# Patient Record
Sex: Male | Born: 1960 | Race: White | Hispanic: No | Marital: Married | Smoking: Current every day smoker
Health system: Southern US, Community
[De-identification: ages and names within clinical notes are randomized; demographics above are authoritative.]

## PROBLEM LIST (undated history)

## (undated) HISTORY — PX: BUNIONECTOMY: SHX129

---

## 1993-07-28 HISTORY — PX: FOOT ARTHROPLASTY: SHX1657

## 1993-07-28 HISTORY — PX: OTHER SURGICAL HISTORY: SHX169

## 2002-09-24 HISTORY — PX: APPENDECTOMY: SHX54

## 2005-04-02 HISTORY — PX: FOOT TENOTOMY: SHX1672

## 2005-04-02 HISTORY — PX: OTHER SURGICAL HISTORY: SHX169

## 2007-06-03 ENCOUNTER — Emergency Department (HOSPITAL_COMMUNITY): Admission: EM | Admit: 2007-06-03 | Discharge: 2007-06-03 | Payer: Self-pay | Admitting: Emergency Medicine

## 2008-05-23 IMAGING — CR DG CHEST 2V
2 series · 2 of 2 positions shown · non-contrast
Comparison: none

CLINICAL DATA: Chest tightness.
 CHEST - 2 VIEWS: 
 No comparison.

[w chest pa]
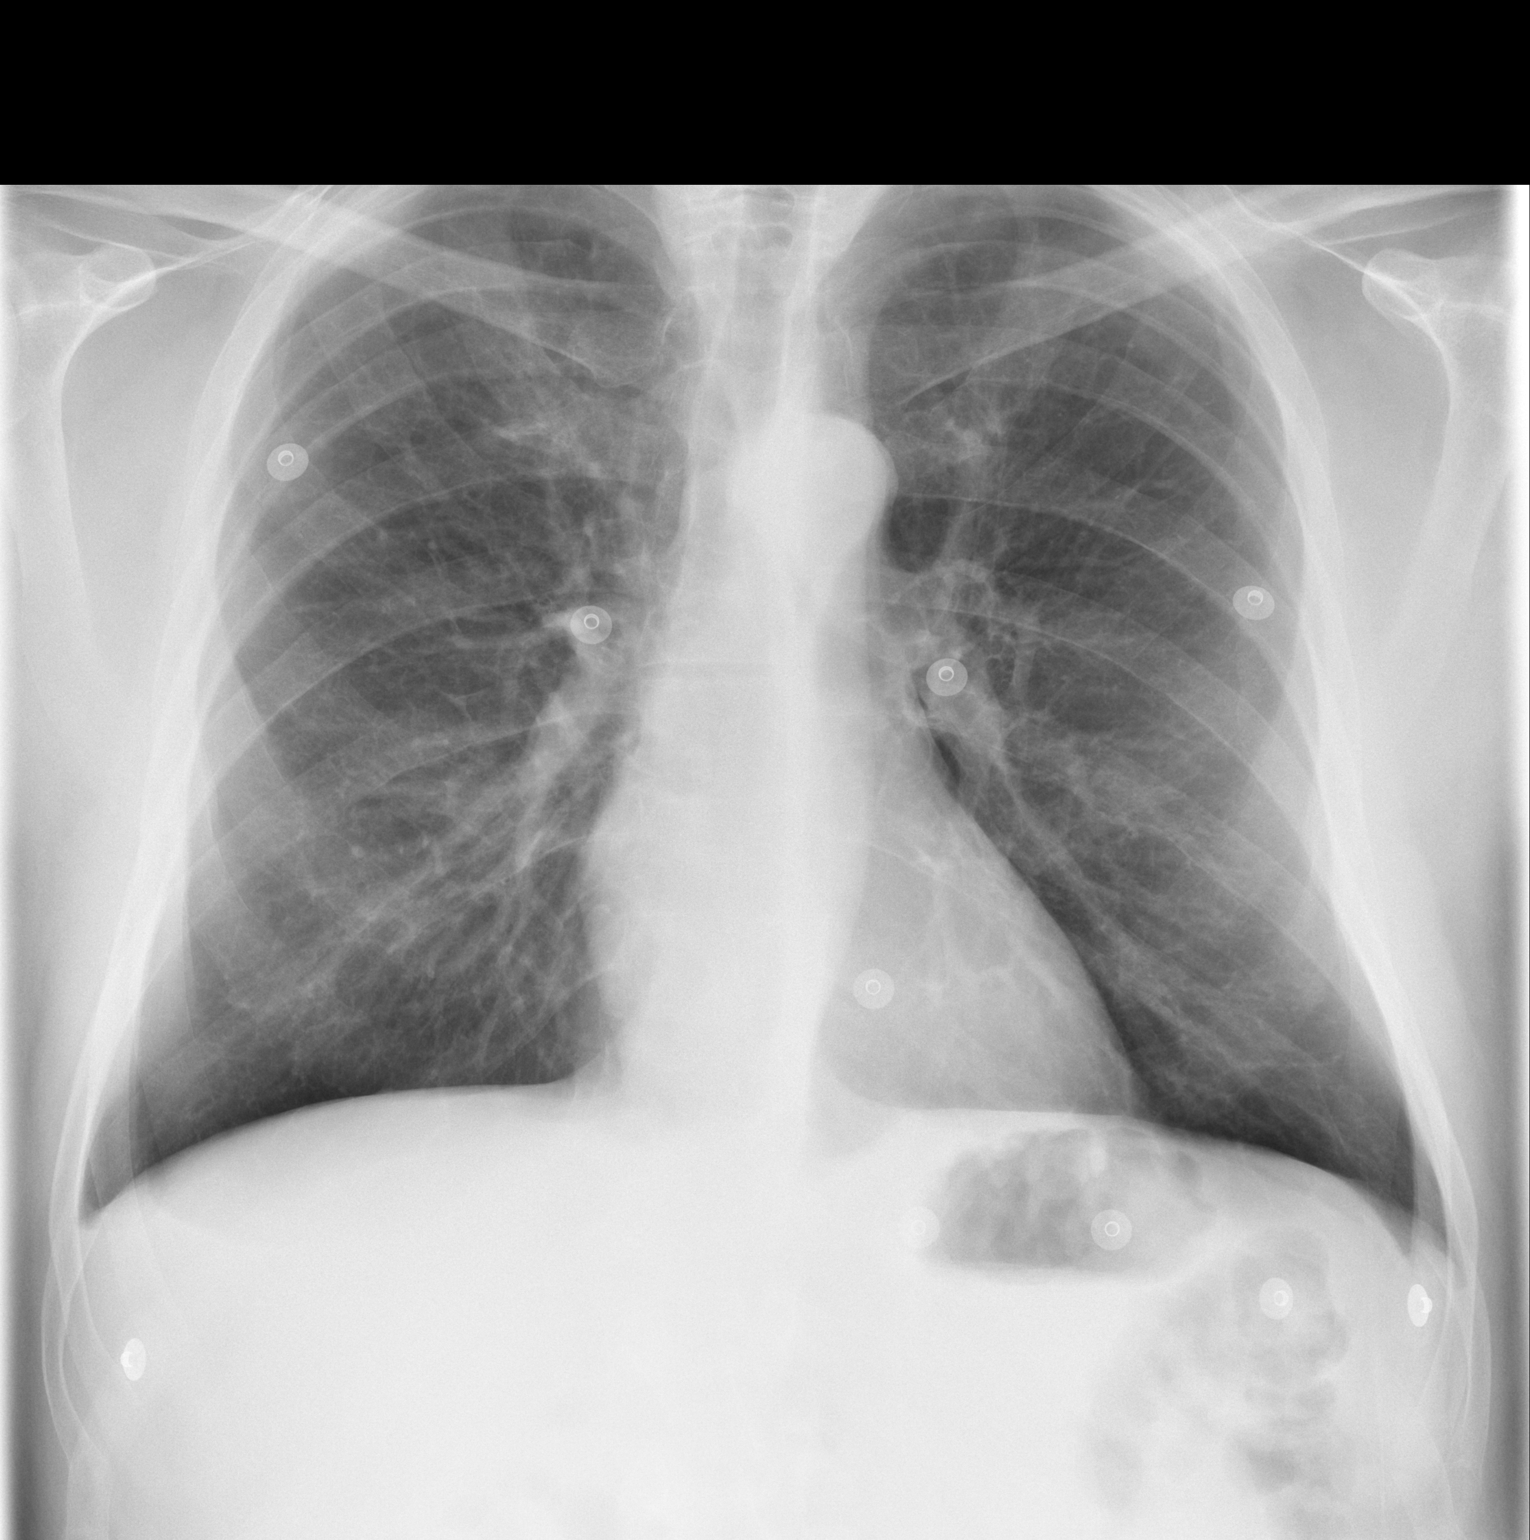

[w chest lat]
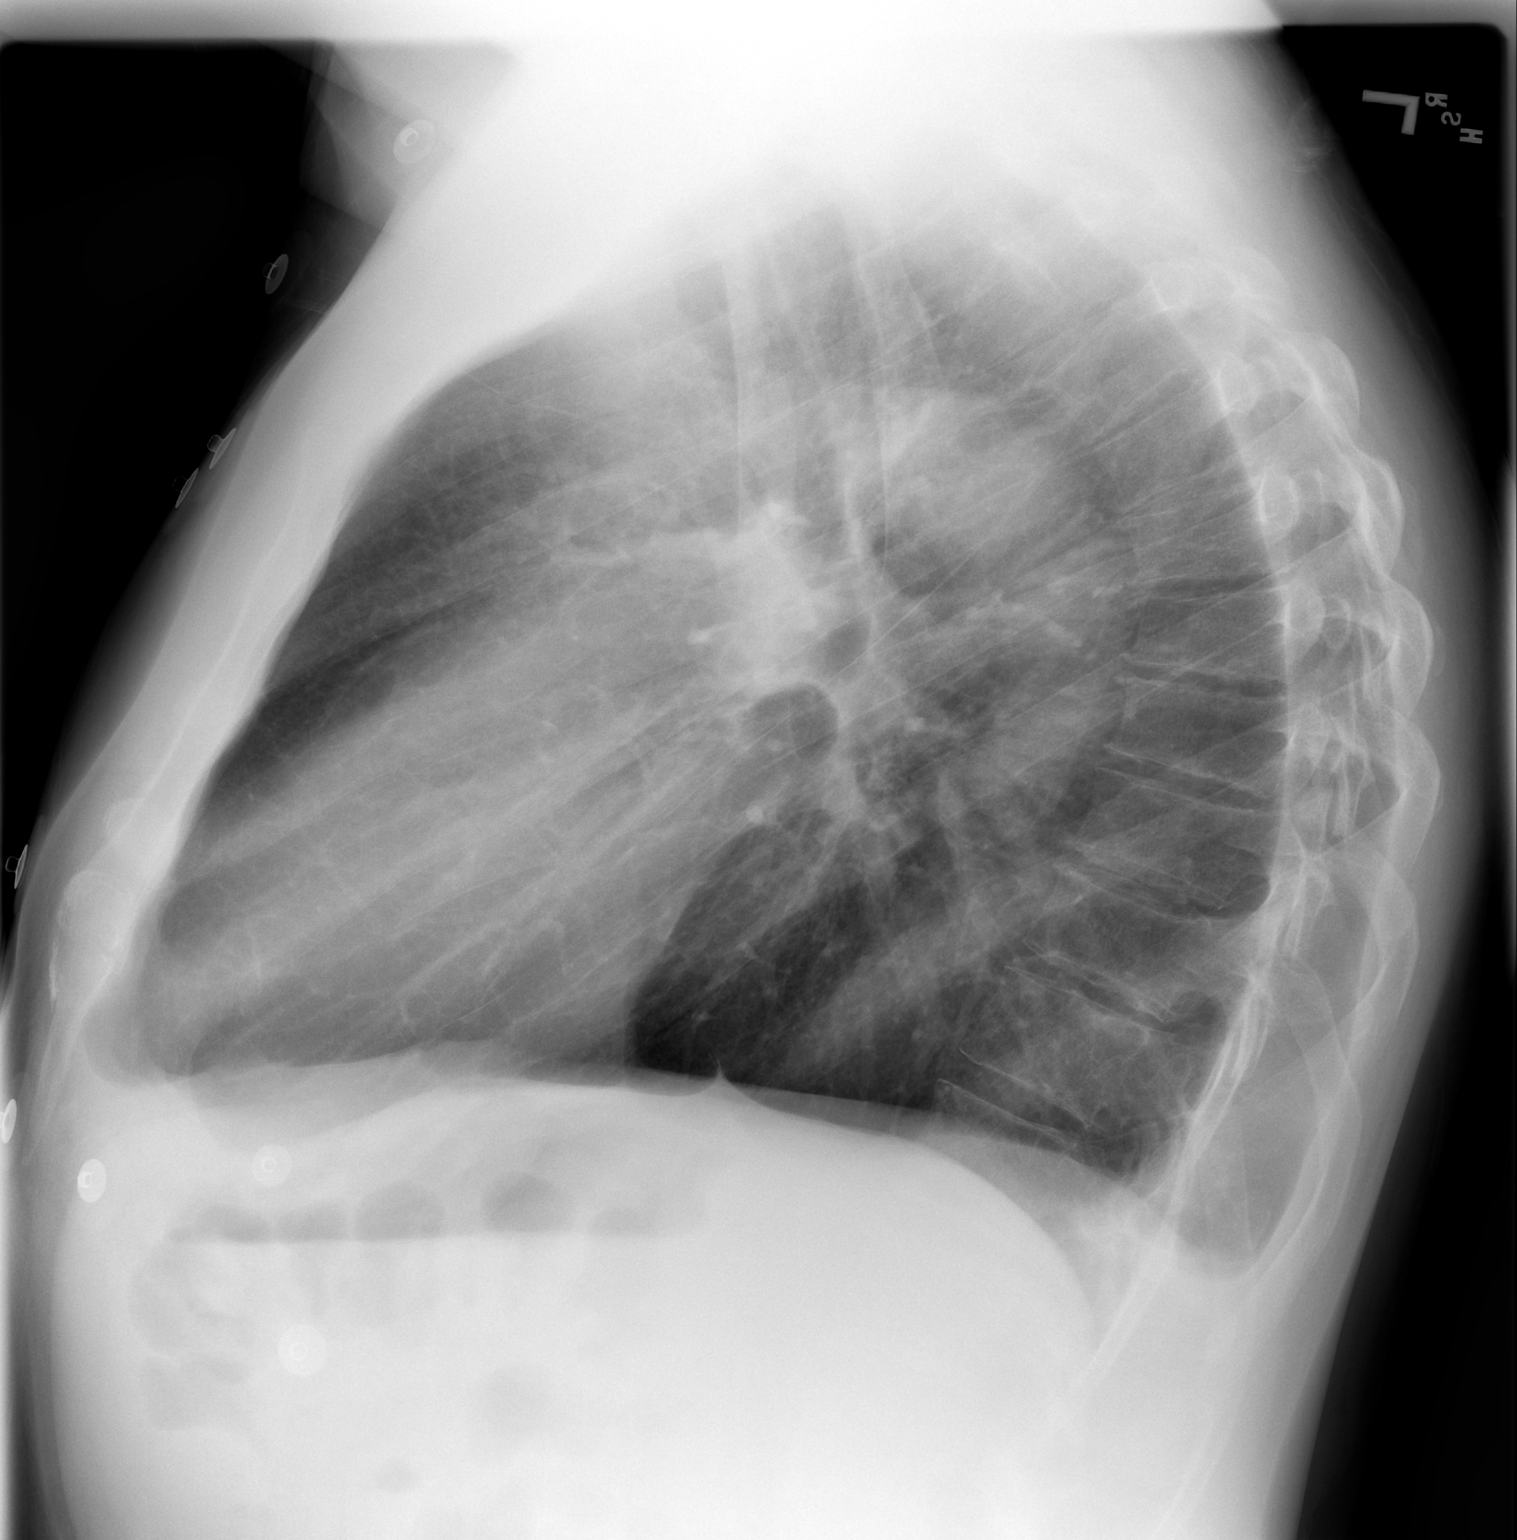

[2 of 2 positions shown; findings below may reference images not displayed]

FINDINGS: Two views of the chest show the lungs to be clear and hyperaerated.  There is slight blunting of the posterior right costophrenic angle which may be chronic.  The heart is within normal limits in size.  No acute bony abnormality is seen.
IMPRESSION: No active lung disease.   Slight hyperaeration.

## 2011-07-06 LAB — I-STAT 8, (EC8 V) (CONVERTED LAB)
Acid-Base Excess: 1
Chloride: 101
HCT: 46
Hemoglobin: 15.6
Operator id: 234501
Potassium: 4.3
Sodium: 134 — ABNORMAL LOW
pCO2, Ven: 49.5

## 2011-07-06 LAB — CBC
Platelets: 358
RDW: 12.3
WBC: 9

## 2011-07-06 LAB — DIFFERENTIAL
Basophils Absolute: 0
Lymphocytes Relative: 22
Lymphs Abs: 2
Neutro Abs: 6.1

## 2011-07-06 LAB — POCT CARDIAC MARKERS: CKMB, poc: 1.4

## 2011-07-06 LAB — POCT I-STAT CREATININE: Operator id: 234501

## 2012-12-15 ENCOUNTER — Encounter: Payer: Self-pay | Admitting: Podiatry

## 2012-12-15 ENCOUNTER — Encounter: Payer: Self-pay | Admitting: *Deleted

## 2012-12-17 ENCOUNTER — Ambulatory Visit (INDEPENDENT_AMBULATORY_CARE_PROVIDER_SITE_OTHER): Payer: 59 | Admitting: Podiatry

## 2012-12-17 ENCOUNTER — Encounter: Payer: Self-pay | Admitting: Podiatry

## 2012-12-17 VITALS — BP 163/84 | HR 77 | Ht 74.0 in | Wt 218.0 lb

## 2012-12-17 DIAGNOSIS — M2040 Other hammer toe(s) (acquired), unspecified foot: Secondary | ICD-10-CM | POA: Insufficient documentation

## 2012-12-17 DIAGNOSIS — M25579 Pain in unspecified ankle and joints of unspecified foot: Secondary | ICD-10-CM

## 2012-12-17 DIAGNOSIS — Q828 Other specified congenital malformations of skin: Secondary | ICD-10-CM

## 2012-12-17 NOTE — Patient Instructions (Addendum)
Debrided all lesions on both feet. New lesion on 5th digit left due to deformed nail. Continue current level of care. Return in one month for palliative care on painful lesions of feet.

## 2012-12-17 NOTE — Progress Notes (Signed)
Subjective: 52 y.o. year old male patient presents complaining of painful corns and calluses and requests to be trimmed.   Patient Summary List & History reviewed for allergies, medications, medical problems and surgical history.  Review of Systems - General ROS: negative for - chills, fatigue, fever, malaise, night sweats, sleep disturbance, weight gain or weight loss  Objective: Dermatologic:  Skin: No acute open lesions. Multiple porokeratosis bilateral. Most severe on the 2nd digit medial aspect, and also have broad calluses under 2nd and 3rd MPJ plantar bilateral. All nails are mildly elongated. 5th digit on left is thickened and uneven with pain on lateral 1/2 of nail base upon weight bearing.  Vascular:  No abnormal erythema or edema. Both feet are cold. Color is mildly white and purplish.  All pedal pulses are palpable.  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 Severe Hallux valgus with recurring bunion on right. Overlapping digits 1st and 2nd right with skin lesion on 2nd right. Neurologic:  All epicritic and tactile sensations grossly intact.  Assessment: 1. Porokeratosis multiple bilateral, symptomatic. 2. 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  Treatment: All lesions debrided. Patient requires to have this trimming done monthly in order to ambulate. Return in one month.

## 2013-01-16 ENCOUNTER — Encounter: Payer: Self-pay | Admitting: Podiatry

## 2013-01-16 ENCOUNTER — Ambulatory Visit (INDEPENDENT_AMBULATORY_CARE_PROVIDER_SITE_OTHER): Payer: 59 | Admitting: Podiatry

## 2013-01-16 VITALS — BP 160/90 | HR 62 | Ht 74.0 in | Wt 210.0 lb

## 2013-01-16 DIAGNOSIS — M25579 Pain in unspecified ankle and joints of unspecified foot: Secondary | ICD-10-CM

## 2013-01-16 DIAGNOSIS — Q828 Other specified congenital malformations of skin: Secondary | ICD-10-CM

## 2013-01-16 NOTE — Progress Notes (Signed)
Subjective:  52 y.o. year old male patient presents complaining of painful corns and calluses and requests to be trimmed.  He worked a lot for the last two weeks and had much pain on both feet L>R.  He is wearing 52 year old Program[employer], [employer].   Patient Summary List & History reviewed for allergies, medications, medical problems and surgical history.  Review of Systems - General ROS: negative for - chills, fatigue, fever, malaise, night sweats, sleep disturbance, weight gain or weight loss.  Objective: Dermatologic:  Skin: No acute open lesions.  Multiple porokeratosis bilateral.  Left foot under 2nd MPJ has deep keratotic lesion with dry old blood stains in callus and is very painful. All nails are mildly elongated.  Vascular: No abnormal erythema or edema.  Both feet are cold. Color is mildly white and purplish.  All pedal pulses are palpable.  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  Severe Hallux valgus with recurring bunion on right.  Overlapping digits 1st and 2nd right with skin lesion on 2nd right.  Neurologic:  All epicritic and tactile sensations grossly intact.   Assessment:  1. Porokeratosis multiple bilateral, symptomatic.  2. Osseous deformities: Subluxed first MPJ right, multiple hammer toe deformities with interdigital and plantar lesions.   Treatment:  All lesions and toe nails debrided.  Existing orthotics examined and found to be adequate. Return in one month.

## 2013-02-17 ENCOUNTER — Ambulatory Visit (INDEPENDENT_AMBULATORY_CARE_PROVIDER_SITE_OTHER): Payer: 59 | Admitting: Podiatry

## 2013-02-17 ENCOUNTER — Encounter: Payer: Self-pay | Admitting: Podiatry

## 2013-02-17 VITALS — BP 141/93 | HR 87

## 2013-02-17 DIAGNOSIS — M2040 Other hammer toe(s) (acquired), unspecified foot: Secondary | ICD-10-CM

## 2013-02-17 DIAGNOSIS — Q828 Other specified congenital malformations of skin: Secondary | ICD-10-CM

## 2013-02-17 DIAGNOSIS — M25579 Pain in unspecified ankle and joints of unspecified foot: Secondary | ICD-10-CM

## 2013-02-17 NOTE — Progress Notes (Signed)
Subjective: Patient returned to have calluses trimmed. Stated that after been treated, his feet start hurting on 3rd week.  No new problems.  Objective:  Multiple plantar calluses. Callused eschar over old porokeratotic lesions, very painful. Multiple digital contracture bilateral. Cavus foot bilateral. Assessment: Intractable porokeratosis plantar bilateral. Plan: Palliation.  All lesions debrided.

## 2013-03-20 ENCOUNTER — Ambulatory Visit (INDEPENDENT_AMBULATORY_CARE_PROVIDER_SITE_OTHER): Payer: 59 | Admitting: Podiatry

## 2013-03-20 DIAGNOSIS — Q828 Other specified congenital malformations of skin: Secondary | ICD-10-CM

## 2013-03-20 DIAGNOSIS — M25579 Pain in unspecified ankle and joints of unspecified foot: Secondary | ICD-10-CM

## 2013-03-20 DIAGNOSIS — M2040 Other hammer toe(s) (acquired), unspecified foot: Secondary | ICD-10-CM

## 2013-03-20 NOTE — Progress Notes (Signed)
Subjective:  52 year old male patient presents complaining of pain on bottom of both feet left more than the right.  He is wearing custom made orthotics. He has been working 12 hour shift 6 days a week, and feet are very painful   Objective:  Multiple plantar calluses, under 2nd and 3rd MPJ left.  Callused eschar over old porokeratotic lesions left foot, very painful.  Multiple digital contracture bilateral.  Cavus foot bilateral.   Assessment:  Intractable porokeratosis plantar bilateral.  Cavus foot with hammer toe deformities.  Plan:  Palliation.  All lesions debrided.

## 2013-04-17 ENCOUNTER — Ambulatory Visit: Payer: 59 | Admitting: Podiatry

## 2013-04-21 ENCOUNTER — Encounter: Payer: Self-pay | Admitting: Podiatry

## 2013-04-21 ENCOUNTER — Ambulatory Visit: Payer: 59 | Admitting: Podiatry

## 2013-04-21 ENCOUNTER — Ambulatory Visit (INDEPENDENT_AMBULATORY_CARE_PROVIDER_SITE_OTHER): Payer: 59 | Admitting: Podiatry

## 2013-04-21 DIAGNOSIS — Q828 Other specified congenital malformations of skin: Secondary | ICD-10-CM

## 2013-04-21 DIAGNOSIS — M2040 Other hammer toe(s) (acquired), unspecified foot: Secondary | ICD-10-CM

## 2013-04-21 DIAGNOSIS — M25579 Pain in unspecified ankle and joints of unspecified foot: Secondary | ICD-10-CM

## 2013-04-21 NOTE — Progress Notes (Signed)
Subjective:  52 year old male patient presents complaining of pain on bottom of both feet from build up callus.  He has been working 12 hour shift 6 days a week, and feet are very painful.  Objective: No new changes. Multiple plantar calluses, under 2nd and 3rd MPJ left.  Callused eschar over old porokeratotic lesions left foot, very painful.  Multiple digital contracture bilateral with interdigital keratotic tissue 4th right. Cavus foot bilateral.   Assessment:  Intractable porokeratosis plantar bilateral.  Cavus foot with hammer toe deformities bilateral. Digital corns 2nd bilateral with distal clavi on 2nd right.  Plan:  Palliation. All lesions debrided. All nails debrided. All lesions debrided.

## 2013-05-22 ENCOUNTER — Ambulatory Visit (INDEPENDENT_AMBULATORY_CARE_PROVIDER_SITE_OTHER): Payer: 59 | Admitting: Podiatry

## 2013-05-22 DIAGNOSIS — Q828 Other specified congenital malformations of skin: Secondary | ICD-10-CM

## 2013-05-22 DIAGNOSIS — M2040 Other hammer toe(s) (acquired), unspecified foot: Secondary | ICD-10-CM

## 2013-05-22 DIAGNOSIS — M25579 Pain in unspecified ankle and joints of unspecified foot: Secondary | ICD-10-CM

## 2013-05-22 NOTE — Progress Notes (Signed)
Subjective:  52 year old male patient presents complaining of pain from build up corns and callus.  He has been working long hours on feet.   Objective: No new changes.  Multiple plantar calluses, under 2nd and 3rd MPJ left.  Callused eschar over old porokeratotic lesions left foot, very painful.  Multiple digital contracture bilateral with interdigital keratotic tissue 4th right.  Cavus foot bilateral.   Assessment:  Intractable porokeratosis plantar bilateral.  Cavus foot with hammer toe deformities bilateral.  Digital corns 2nd bilateral with distal clavi on 2nd right.   Plan:  Palliation. All lesions debrided. All nails debrided.  All lesions debrided.

## 2013-06-23 ENCOUNTER — Encounter: Payer: Self-pay | Admitting: Podiatry

## 2013-06-23 ENCOUNTER — Ambulatory Visit (INDEPENDENT_AMBULATORY_CARE_PROVIDER_SITE_OTHER): Payer: 59 | Admitting: Podiatry

## 2013-06-23 VITALS — BP 153/98 | HR 69 | Ht 74.0 in | Wt 209.0 lb

## 2013-06-23 DIAGNOSIS — Q828 Other specified congenital malformations of skin: Secondary | ICD-10-CM

## 2013-06-23 DIAGNOSIS — M25579 Pain in unspecified ankle and joints of unspecified foot: Secondary | ICD-10-CM

## 2013-06-23 DIAGNOSIS — M2040 Other hammer toe(s) (acquired), unspecified foot: Secondary | ICD-10-CM

## 2013-06-23 NOTE — Patient Instructions (Addendum)
Seen for painful calluses. Debrided all nails and calluses. Return as neededn.

## 2013-06-23 NOTE — Progress Notes (Signed)
Subjective:  52 year old male patient presents complaining of pain from build up corns and callus.   Objective: No new changes.  Multiple plantar calluses, under 2nd and 3rd MPJ left.  Callused eschar over old porokeratotic lesions left foot, very painful.  Multiple digital contracture bilateral with interdigital keratotic tissue 4th right.  Cavus foot bilateral.   Assessment:  All nails are hypertrophic. Intractable porokeratosis plantar bilateral.  Cavus foot with hammer toe deformities bilateral.  Digital corns 2nd bilateral with distal clavi on 2nd right.    Plan:  All lesions debrided. All nails debrided.  Return as needed.

## 2013-07-21 ENCOUNTER — Encounter: Payer: Self-pay | Admitting: Podiatry

## 2013-07-21 ENCOUNTER — Ambulatory Visit (INDEPENDENT_AMBULATORY_CARE_PROVIDER_SITE_OTHER): Payer: 59 | Admitting: Podiatry

## 2013-07-21 VITALS — BP 155/87 | HR 69

## 2013-07-21 DIAGNOSIS — Q828 Other specified congenital malformations of skin: Secondary | ICD-10-CM

## 2013-07-21 DIAGNOSIS — M2040 Other hammer toe(s) (acquired), unspecified foot: Secondary | ICD-10-CM

## 2013-07-21 DIAGNOSIS — M25579 Pain in unspecified ankle and joints of unspecified foot: Secondary | ICD-10-CM

## 2013-07-21 NOTE — Progress Notes (Signed)
Subjective:  52 year old male patient presents complaining of painful corns and callus.   Objective: No new changes.  Digital corns are very painful on 2nd right, 4th left. Callused eschar over old porokeratotic lesions left foot, very painful.  Multiple digital contracture bilateral with interdigital keratotic tissue 4th right.  Cavus foot bilateral.   Assessment:  Intractable porokeratosis plantar bilateral.  Cavus foot with hammer toe deformities bilateral.  Severe hammer toe with digital corns bilateral. Digital corns 2nd bilateral with distal clavi on 2nd right.   Plan:  All lesions debrided. Buttress pad dispensed. May benefit from Flexor Tenotomy of 2nd right, 4th left.  Return as needed.

## 2013-07-21 NOTE — Patient Instructions (Signed)
Seen for painful corns and calluses. All debrided. Buttress pad dispensed for right 2nd and 4th left. Use them as needed.

## 2013-07-24 ENCOUNTER — Ambulatory Visit: Payer: 59 | Admitting: Podiatry

## 2013-07-28 ENCOUNTER — Ambulatory Visit: Payer: 59 | Admitting: Podiatry

## 2013-08-25 ENCOUNTER — Ambulatory Visit: Payer: 59 | Admitting: Podiatry

## 2013-08-28 ENCOUNTER — Ambulatory Visit (INDEPENDENT_AMBULATORY_CARE_PROVIDER_SITE_OTHER): Payer: 59 | Admitting: Podiatry

## 2013-08-28 ENCOUNTER — Encounter: Payer: Self-pay | Admitting: Podiatry

## 2013-08-28 VITALS — BP 137/81 | HR 91

## 2013-08-28 DIAGNOSIS — M2040 Other hammer toe(s) (acquired), unspecified foot: Secondary | ICD-10-CM

## 2013-08-28 DIAGNOSIS — M25579 Pain in unspecified ankle and joints of unspecified foot: Secondary | ICD-10-CM

## 2013-08-28 DIAGNOSIS — Q828 Other specified congenital malformations of skin: Secondary | ICD-10-CM

## 2013-08-28 NOTE — Progress Notes (Signed)
Subjective:  52 year old male patient presents complaining of pain from build up corns and callus.   Objective: No new changes.  Multiple plantar calluses, under 2nd and 3rd MPJ left, and at contact surface of the 2nd digit right. Callused eschar over old porokeratotic lesions left foot, very painful.   Assessment:  Intractable porokeratosis plantar bilateral.  Digital corns 2nd rightl with distal clavi on 2nd right.   Plan:  All lesions debrided. All nails debrided.  Return as needed.

## 2013-08-28 NOTE — Patient Instructions (Signed)
Seen for hypertrophic calluses. Debrided all calluses. Return as needed.

## 2013-09-29 ENCOUNTER — Ambulatory Visit (INDEPENDENT_AMBULATORY_CARE_PROVIDER_SITE_OTHER): Payer: 59 | Admitting: Podiatry

## 2013-09-29 ENCOUNTER — Encounter: Payer: Self-pay | Admitting: Podiatry

## 2013-09-29 VITALS — BP 141/89 | HR 77

## 2013-09-29 DIAGNOSIS — M2040 Other hammer toe(s) (acquired), unspecified foot: Secondary | ICD-10-CM

## 2013-09-29 DIAGNOSIS — M25579 Pain in unspecified ankle and joints of unspecified foot: Secondary | ICD-10-CM

## 2013-09-29 DIAGNOSIS — Q828 Other specified congenital malformations of skin: Secondary | ICD-10-CM

## 2013-09-29 NOTE — Patient Instructions (Signed)
Seen for hypertrophic Calluses. All corns and calluses debrided. Return as needed.

## 2013-09-29 NOTE — Progress Notes (Signed)
Subjective:  53 year old male patient presents complaining of pain from build up corns and callus.   Objective: No new changes.  Multiple plantar calluses, under 2nd and 3rd MPJ left, and at contact surface of the 2nd digit right.  Callused eschar over old porokeratotic lesions left foot, very painful.  Hypertrophic nails x 10. Contracted digits bilateral.  Assessment:  Intractable porokeratosis plantar bilateral.  Digital corns 2nd rightl with distal clavi on 2nd right.  Multiple hammer toes bilateral.  Plan:  All lesions debrided. All nails debrided.  Return as needed.

## 2013-10-30 ENCOUNTER — Ambulatory Visit: Payer: 59 | Admitting: Podiatry

## 2013-12-09 ENCOUNTER — Ambulatory Visit: Payer: 59 | Admitting: Podiatry

## 2013-12-09 ENCOUNTER — Ambulatory Visit (INDEPENDENT_AMBULATORY_CARE_PROVIDER_SITE_OTHER): Payer: 59 | Admitting: Podiatry

## 2013-12-09 ENCOUNTER — Encounter: Payer: Self-pay | Admitting: Podiatry

## 2013-12-09 VITALS — BP 141/98 | HR 95

## 2013-12-09 DIAGNOSIS — Q828 Other specified congenital malformations of skin: Secondary | ICD-10-CM

## 2013-12-09 DIAGNOSIS — M25579 Pain in unspecified ankle and joints of unspecified foot: Secondary | ICD-10-CM

## 2013-12-09 DIAGNOSIS — M2040 Other hammer toe(s) (acquired), unspecified foot: Secondary | ICD-10-CM

## 2013-12-09 NOTE — Patient Instructions (Signed)
Seen for painful calluses. All calluses debrided. Return as needed.  

## 2013-12-09 NOTE — Progress Notes (Signed)
Subjective:  53 year old male patient presents complaining of pain from build up corns and callus.   Objective:  Multiple plantar calluses, under 2nd and 3rd MPJ left, and at contact surface of the 2nd digit right.  Callused eschar over old porokeratotic lesions left foot, very painful.  Hypertrophic nails x 10.  Contracted digits bilateral.   Assessment:  Intractable porokeratosis plantar bilateral.  Digital corns 2nd rightl with distal clavi on 2nd right.  Multiple hammer toes bilateral.   Plan:  All lesions debrided. Return as needed.

## 2014-01-05 ENCOUNTER — Ambulatory Visit (INDEPENDENT_AMBULATORY_CARE_PROVIDER_SITE_OTHER): Payer: 59 | Admitting: Podiatry

## 2014-01-05 ENCOUNTER — Encounter: Payer: Self-pay | Admitting: Podiatry

## 2014-01-05 VITALS — BP 153/94 | HR 80

## 2014-01-05 DIAGNOSIS — Q828 Other specified congenital malformations of skin: Secondary | ICD-10-CM

## 2014-01-05 DIAGNOSIS — M25579 Pain in unspecified ankle and joints of unspecified foot: Secondary | ICD-10-CM

## 2014-01-05 DIAGNOSIS — M2040 Other hammer toe(s) (acquired), unspecified foot: Secondary | ICD-10-CM

## 2014-01-05 DIAGNOSIS — B353 Tinea pedis: Secondary | ICD-10-CM

## 2014-01-05 NOTE — Patient Instructions (Signed)
Seen for painful corns and calluses.  Noted of macerating skin in between digits left foot.  All corns and calluses debrided.  Soak or cleanse feet with Salsun blue shampoo or Vinegar water.  Dry well and apply Tinactin foot power in between digits left foot.  Return in one month.

## 2014-01-05 NOTE — Progress Notes (Signed)
Subjective:  53 year old male patient presents complaining of pain from build up corns and callus.  He is still smoking one and a half pack a day.   Objective:  Multiple plantar calluses, under 2nd and 3rd MPJ left, and at contact surface of the 2nd digit right.  Callused eschar over old porokeratotic lesions left foot, very painful.  Macerating digits in 3rd and 4th web spaces left foot.  Contracted digits bilateral.   Assessment:  Intractable porokeratosis plantar bilateral.  Digital corns 2nd rightl with distal clavi on 2nd right.  Multiple hammer toes bilateral.  Tinea pedis left inter digital spaces.   Plan:  All lesions debrided.  Instructed to cleanse between digits well with Salsun blue shampoo or with Vinegar water, then apply Tinactin foot powder.  Return in one month.

## 2014-02-05 ENCOUNTER — Encounter: Payer: Self-pay | Admitting: Podiatry

## 2014-02-05 ENCOUNTER — Ambulatory Visit (INDEPENDENT_AMBULATORY_CARE_PROVIDER_SITE_OTHER): Payer: 59 | Admitting: Podiatry

## 2014-02-05 VITALS — BP 173/96 | HR 76

## 2014-02-05 DIAGNOSIS — M2040 Other hammer toe(s) (acquired), unspecified foot: Secondary | ICD-10-CM

## 2014-02-05 DIAGNOSIS — Q828 Other specified congenital malformations of skin: Secondary | ICD-10-CM

## 2014-02-05 DIAGNOSIS — M25579 Pain in unspecified ankle and joints of unspecified foot: Secondary | ICD-10-CM

## 2014-02-05 NOTE — Progress Notes (Signed)
Subjective:  53 year old male patient presents complaining of pain from build up corns and callus.  Been using prescription cream for Tinea pedis.  Objective:  Multiple plantar calluses, under 2nd and 3rd MPJ left, and at contact surface of the 2nd digit right.  Callused eschar over old porokeratotic lesions left foot, very painful.  Contracted digits bilateral.  No open skin lesions.   Assessment:  Intractable porokeratosis plantar bilateral.  Mycotic nails x 10.  Multiple hammer toes bilateral.  .  Plan:  All nails and calluses lesions debrided.  Return in one month.

## 2014-02-05 NOTE — Patient Instructions (Signed)
Seen for hypertrophic nails and painful calluses. All nails and calluses debrided. Return in 3 months or as needed.  

## 2014-03-09 ENCOUNTER — Encounter: Payer: Self-pay | Admitting: Podiatry

## 2014-03-09 ENCOUNTER — Ambulatory Visit (INDEPENDENT_AMBULATORY_CARE_PROVIDER_SITE_OTHER): Payer: 59 | Admitting: Podiatry

## 2014-03-09 VITALS — BP 157/99 | HR 86

## 2014-03-09 DIAGNOSIS — M2040 Other hammer toe(s) (acquired), unspecified foot: Secondary | ICD-10-CM

## 2014-03-09 DIAGNOSIS — M25579 Pain in unspecified ankle and joints of unspecified foot: Secondary | ICD-10-CM

## 2014-03-09 DIAGNOSIS — M79609 Pain in unspecified limb: Secondary | ICD-10-CM

## 2014-03-09 DIAGNOSIS — Q828 Other specified congenital malformations of skin: Secondary | ICD-10-CM

## 2014-03-09 NOTE — Progress Notes (Signed)
Subjective:  53 year old male patient presents complaining of painful corns and callus.   Objective:  Multiple plantar calluses, under 2nd and 3rd MPJ left, and at contact surface of the 2nd digit right.  Callused eschar over old porokeratotic lesions left foot, very painful.  Contracted digits bilateral.  No open skin lesions.   Assessment:  Intractable porokeratosis plantar bilateral.  Mycotic nails x 10.  Multiple hammer toes bilateral.   Plan:  All nails and calluses lesions debrided.  Return in one month.

## 2014-03-09 NOTE — Patient Instructions (Signed)
Seen for hypertrophic calluses. All corns and calluses debrided. Return as needed.

## 2014-04-09 ENCOUNTER — Ambulatory Visit: Payer: 59 | Admitting: Podiatry

## 2014-04-21 ENCOUNTER — Ambulatory Visit (INDEPENDENT_AMBULATORY_CARE_PROVIDER_SITE_OTHER): Payer: 59 | Admitting: Podiatry

## 2014-04-21 ENCOUNTER — Encounter: Payer: Self-pay | Admitting: Podiatry

## 2014-04-21 VITALS — BP 150/91 | HR 70

## 2014-04-21 DIAGNOSIS — Q828 Other specified congenital malformations of skin: Secondary | ICD-10-CM

## 2014-04-21 DIAGNOSIS — M2040 Other hammer toe(s) (acquired), unspecified foot: Secondary | ICD-10-CM

## 2014-04-21 NOTE — Progress Notes (Signed)
Subjective:  53 year old male patient presents complaining of painful corns and callus as usual.  No new problems.   Objective:  Multiple plantar calluses, under 2nd and 3rd MPJ left, and at contact surface of the 2nd digit right.  Callused eschar over old porokeratotic lesions left foot, very painful.  Contracted digits bilateral.  No open skin lesions.   Assessment:  Intractable porokeratosis plantar bilateral.  Multiple hammer toes bilateral.   Plan:  All calluses debrided.  Return in one month or as needed.

## 2014-04-21 NOTE — Patient Instructions (Signed)
Seen for hypertrophic calluses. All calluses debrided. Return as needed.  

## 2014-05-21 ENCOUNTER — Encounter: Payer: Self-pay | Admitting: Podiatry

## 2014-05-21 ENCOUNTER — Ambulatory Visit (INDEPENDENT_AMBULATORY_CARE_PROVIDER_SITE_OTHER): Payer: 59 | Admitting: Podiatry

## 2014-05-21 VITALS — BP 153/87 | HR 79

## 2014-05-21 DIAGNOSIS — Q828 Other specified congenital malformations of skin: Secondary | ICD-10-CM

## 2014-05-21 DIAGNOSIS — M2040 Other hammer toe(s) (acquired), unspecified foot: Secondary | ICD-10-CM

## 2014-05-21 NOTE — Patient Instructions (Signed)
Seen for hypertrophic calluses. All calluses debrided. Return as needed.  

## 2014-05-21 NOTE — Progress Notes (Signed)
Subjective:  53 year old male patient presents complaining of painful corns and callus as usual.  Had worn tight shoes yesterday and got a blister on left big toe.  Objective:  Multiple plantar calluses, under 2nd and 3rd MPJ left, and at contact surface of the 2nd digit right.  Callused eschar over old porokeratotic lesions left foot, very painful.  Contracted digits bilateral.  Skin abrasion left hallux from a bad shoes.   Assessment:  Intractable porokeratosis plantar bilateral.  Multiple hammer toes bilateral.  Skin abrasion without infection.  Plan:  All calluses debrided.  Left hallux cleansed and Amerigel ointment applied.  Return in one month or as needed.

## 2014-06-22 ENCOUNTER — Ambulatory Visit: Payer: 59 | Admitting: Podiatry

## 2014-06-22 ENCOUNTER — Ambulatory Visit (INDEPENDENT_AMBULATORY_CARE_PROVIDER_SITE_OTHER): Payer: 59 | Admitting: Podiatry

## 2014-06-22 ENCOUNTER — Encounter: Payer: Self-pay | Admitting: Podiatry

## 2014-06-22 VITALS — BP 156/92 | HR 79

## 2014-06-22 DIAGNOSIS — Q828 Other specified congenital malformations of skin: Secondary | ICD-10-CM

## 2014-06-22 DIAGNOSIS — M25579 Pain in unspecified ankle and joints of unspecified foot: Secondary | ICD-10-CM

## 2014-06-22 NOTE — Progress Notes (Signed)
Subjective:  53 year old male patient presents complaining of painful corns and callus as usual.   Objective:  Multiple plantar calluses, under 2nd and 3rd MPJ left, and at contact surface of the 2nd digit right.  Callused eschar over old porokeratotic lesions left foot, very painful.  Contracted digits bilateral.  Broken skin at the first web space right foot.   Assessment:  Intractable porokeratosis plantar bilateral.  Multiple hammer toes bilateral.  Tinea pedis web space right foot.   Plan:  All calluses debrided.  Right first web space cleansed with Iodine and applied antifungal ointment. Patient is to apply Tinactin foot power with cotton ball. Return in one month or as needed

## 2014-06-22 NOTE — Patient Instructions (Signed)
Use Tinactin powder and cotton for right first web space.

## 2014-07-23 ENCOUNTER — Ambulatory Visit: Payer: 59 | Admitting: Podiatry

## 2014-09-15 ENCOUNTER — Encounter: Payer: Self-pay | Admitting: Podiatry

## 2014-09-15 ENCOUNTER — Ambulatory Visit (INDEPENDENT_AMBULATORY_CARE_PROVIDER_SITE_OTHER): Payer: 59 | Admitting: Podiatry

## 2014-09-15 VITALS — BP 152/89 | HR 71

## 2014-09-15 DIAGNOSIS — M25579 Pain in unspecified ankle and joints of unspecified foot: Secondary | ICD-10-CM

## 2014-09-15 DIAGNOSIS — M2040 Other hammer toe(s) (acquired), unspecified foot: Secondary | ICD-10-CM

## 2014-09-15 DIAGNOSIS — Q828 Other specified congenital malformations of skin: Secondary | ICD-10-CM

## 2014-09-15 NOTE — Patient Instructions (Signed)
Seen for hypertrophic corns and calluses. All corns and calluses debrided. Return in one month or as needed.

## 2014-09-15 NOTE — Progress Notes (Signed)
Subjective:  [age] year old male patient presents complaining of painful corns and callus as usual.  No new problems.   Objective:  Multiple plantar calluses, under 2nd and 3rd MPJ left, and at contact surface of the 2nd digit right.  Callused eschar over old porokeratotic lesions left foot, very painful.  Contracted digits bilateral.  Broken skin at the first web space right foot.   Assessment:  Intractable porokeratosis plantar bilateral.  Multiple hammer toes bilateral.   Plan:  All calluses debrided.  Return in one month or as needed

## 2014-10-15 ENCOUNTER — Ambulatory Visit: Payer: 59 | Admitting: Podiatry

## 2015-05-20 ENCOUNTER — Ambulatory Visit (INDEPENDENT_AMBULATORY_CARE_PROVIDER_SITE_OTHER): Payer: BLUE CROSS/BLUE SHIELD | Admitting: Podiatry

## 2015-05-20 ENCOUNTER — Encounter: Payer: Self-pay | Admitting: Podiatry

## 2015-05-20 VITALS — BP 158/88 | HR 82

## 2015-05-20 DIAGNOSIS — M2040 Other hammer toe(s) (acquired), unspecified foot: Secondary | ICD-10-CM | POA: Diagnosis not present

## 2015-05-20 DIAGNOSIS — M25579 Pain in unspecified ankle and joints of unspecified foot: Secondary | ICD-10-CM | POA: Diagnosis not present

## 2015-05-20 DIAGNOSIS — Q828 Other specified congenital malformations of skin: Secondary | ICD-10-CM | POA: Diagnosis not present

## 2015-05-20 NOTE — Progress Notes (Signed)
Subjective:  54 year old male patient presents complaining of painful corns and callus as usual.  Been doing well. Calluses are not as bad on both feet.   Objective:  Painful digital corn 2nd right.  Previous callused eschar over old porokeratotic lesions have improved with minimum callus.  Contracted and enlarged PIPJ medial aspect 2nd right.  Severe hallux valgus with pressure to 2nd digits R>L.   Assessment:  Intractable porokeratosis digital on both feet. Multiple hammer toes bilateral.  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   Plan:  All calluses debrided.  Return as needed

## 2015-05-20 NOTE — Patient Instructions (Signed)
Seen for hypertrophic corns and calluses. All corns and calluses debrided. Return as needed.
